# Patient Record
Sex: Male | Born: 1995 | Race: White | Hispanic: No | Marital: Single | Smoking: Current every day smoker
Health system: Southern US, Community
[De-identification: ages and names within clinical notes are randomized; demographics above are authoritative.]

## PROBLEM LIST (undated history)

## (undated) HISTORY — PX: TONSILLECTOMY: SUR1361

---
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
## 2014-11-05 ENCOUNTER — Emergency Department
Admission: EM | Admit: 2014-11-05 | Discharge: 2014-11-05 | Disposition: A | Discharge status: Home / Self Care | Payer: Medicaid Other | Attending: Emergency Medicine | Admitting: Emergency Medicine

## 2014-11-05 ENCOUNTER — Encounter: Payer: Self-pay | Admitting: Emergency Medicine

## 2014-11-05 ENCOUNTER — Emergency Department: Payer: Medicaid Other

## 2014-11-05 DIAGNOSIS — Y998 Other external cause status: Secondary | ICD-10-CM | POA: Diagnosis not present

## 2014-11-05 DIAGNOSIS — X58XXXA Exposure to other specified factors, initial encounter: Secondary | ICD-10-CM | POA: Insufficient documentation

## 2014-11-05 DIAGNOSIS — Z720 Tobacco use: Secondary | ICD-10-CM | POA: Diagnosis not present

## 2014-11-05 DIAGNOSIS — S299XXA Unspecified injury of thorax, initial encounter: Secondary | ICD-10-CM | POA: Diagnosis present

## 2014-11-05 DIAGNOSIS — R0781 Pleurodynia: Secondary | ICD-10-CM

## 2014-11-05 DIAGNOSIS — Y9289 Other specified places as the place of occurrence of the external cause: Secondary | ICD-10-CM | POA: Diagnosis not present

## 2014-11-05 DIAGNOSIS — Y9389 Activity, other specified: Secondary | ICD-10-CM | POA: Insufficient documentation

## 2014-11-05 DIAGNOSIS — S20211A Contusion of right front wall of thorax, initial encounter: Secondary | ICD-10-CM

## 2014-11-05 MED ORDER — IBUPROFEN 600 MG PO TABS
600.0000 mg | ORAL_TABLET | Freq: Once | ORAL | Status: AC
  Administered 2014-11-05: 600 mg via ORAL
  Filled 2014-11-05: qty 1

## 2014-11-05 NOTE — ED Notes (Signed)
Pt to ED from home c/o right rib pain.  Pt states moving furniture this afternoon when he felt a pop and sudden onset of severe sharp pain.  States more pain with movement and deep breaths.  Pt is A&Ox4, speaking in complete and coherent sentences and in NAD at this time.

## 2014-11-05 NOTE — ED Provider Notes (Signed)
[facility] Emergency Department Provider Note    ____________________________________________  Time seen: 0225  I have reviewed the triage vital signs and the nursing notes.   HISTORY  Chief Complaint Muscle Pain   History limited by: Not Limited   HPI Corey Ferguson is a 19 y.o. male who presents to the emergency department today because of right rib pain. He states that this started suddenly this afternoon. He states he was moving a heavy box time felt a pop. Since then he has been having sharp pain on the right side. He states it is worse with deep breaths. He does not have any associated shortness breath. He denies any fevers. He states he does have a history of a hairline fracture in his ribs in the past and this reminds him of that.   History reviewed. No pertinent past medical history.  There are no active problems to display for this patient.   Past Surgical History  Procedure Laterality Date  . Tonsillectomy      No current outpatient prescriptions on file.  Allergies Review of patient's allergies indicates no known allergies.  History reviewed. No pertinent family history.  Social History History  Substance Use Topics  . Smoking status: Current Every Day Smoker  . Smokeless tobacco: Not on file  . Alcohol Use: No    Review of Systems  Constitutional: Negative for fever. Cardiovascular: Positive for right-sided chest pain. Respiratory: Negative for shortness of breath. Gastrointestinal: Negative for abdominal pain, vomiting and diarrhea. Genitourinary: Negative for dysuria. Musculoskeletal: Negative for back pain. Skin: Negative for rash. Neurological: Negative for headaches, focal weakness or numbness.   10-point ROS otherwise negative.  ____________________________________________   PHYSICAL EXAM:  VITAL SIGNS: ED Triage Vitals  Enc Vitals Group     BP 11/05/14 0042 138/66 mmHg     Pulse Rate 11/05/14 0042 79      Resp 11/05/14 0042 20     Temp 11/05/14 0042 98.2 F (36.8 C)     Temp Source 11/05/14 0042 Oral     SpO2 11/05/14 0042 98 %     Weight 11/05/14 0042 235 lb (106.595 kg)     Height 11/05/14 0042  (1.778 m)     Head Cir --      Peak Flow --      Pain Score 11/05/14 0042 8   Constitutional: Alert and oriented. Well appearing and in no distress. Eyes: Conjunctivae are normal. PERRL. Normal extraocular movements. ENT   Head: Normocephalic and atraumatic.   Nose: No congestion/rhinnorhea.   Mouth/Throat: Mucous membranes are moist.   Neck: No stridor. Hematological/Lymphatic/Immunilogical: No cervical lymphadenopathy. Cardiovascular: Normal rate, regular rhythm.  No murmurs, rubs, or gallops. Respiratory: Normal respiratory effort without tachypnea nor retractions. Breath sounds are clear and equal bilaterally. No wheezes/rales/rhonchi. Tender to palpation over the anterior right ribs. Gastrointestinal: Soft and nontender. No distention. There is no CVA tenderness. Genitourinary: Deferred Musculoskeletal: Normal range of motion in all extremities. No joint effusions.  No lower extremity tenderness nor edema. Neurologic:  Normal speech and language. No gross focal neurologic deficits are appreciated. Speech is normal.  Skin:  Skin is warm, dry and intact. No rash noted. Psychiatric: Mood and affect are normal. Speech and behavior are normal. Patient exhibits appropriate insight and judgment.  ____________________________________________    LABS (pertinent positives/negatives)  None  ____________________________________________   EKG  None  ____________________________________________    RADIOLOGY  Right rib series IMPRESSION: Negative. ____________________________________________   PROCEDURES  Procedure(s) performed: None  Critical Care performed: No  ____________________________________________   INITIAL IMPRESSION / ASSESSMENT AND PLAN  / ED COURSE  Pertinent labs & imaging results that were available during my care of the patient were reviewed by me and considered in my medical decision making (see chart for details).  Patient presents to the emergency department today because of right chest wall pain. Right rib series without any concerning findings. I discussed with patient findings an x-ray.  ____________________________________________   FINAL CLINICAL IMPRESSION(S) / ED DIAGNOSES  Final diagnoses:  Rib pain on right side  Rib contusion, right, initial encounter     Phineas Semen, MD 11/05/14 [phone]

## 2014-11-05 NOTE — ED Notes (Signed)
Pt presents to ER alert and in NAD. Pt states pain in right rib after moving heavy object. Pt ambulatory.

## 2014-11-05 NOTE — Discharge Instructions (Signed)
Please seek medical attention for any high fevers, chest pain, shortness of breath, change in behavior, persistent vomiting, bloody stool or any other new or concerning symptoms. ° °Chest Contusion °A chest contusion is a deep bruise on your chest area. Contusions are the result of an injury that caused bleeding under the skin. A chest contusion may involve bruising of the skin, muscles, or ribs. The contusion may turn blue, purple, or yellow. Minor injuries will give you a painless contusion, but more severe contusions may stay painful and swollen for a few weeks. °CAUSES  °A contusion is usually caused by a blow, trauma, or direct force to an area of the body. °SYMPTOMS  °· Swelling and redness of the injured area. °· Discoloration of the injured area. °· Tenderness and soreness of the injured area. °· Pain. °DIAGNOSIS  °The diagnosis can be made by taking a history and performing a physical exam. An X-ray, CT scan, or MRI may be needed to determine if there were any associated injuries, such as broken bones (fractures) or internal injuries. °TREATMENT  °Often, the best treatment for a chest contusion is resting, icing, and applying cold compresses to the injured area. Deep breathing exercises may be recommended to reduce the risk of pneumonia. Over-the-counter medicines may also be recommended for pain control. °HOME CARE INSTRUCTIONS  °· Put ice on the injured area. °¨ Put ice in a plastic bag. °¨ Place a towel between your skin and the bag. °¨ Leave the ice on for 15-20 minutes, 03-04 times a day. °· Only take over-the-counter or prescription medicines as directed by your caregiver. Your caregiver may recommend avoiding anti-inflammatory medicines (aspirin, ibuprofen, and naproxen) for 48 hours because these medicines may increase bruising. °· Rest the injured area. °· Perform deep-breathing exercises as directed by your caregiver. °· Stop smoking if you smoke. °· Do not lift objects over 5 pounds (2.3 kg) for  3 days or longer if recommended by your caregiver. °SEEK IMMEDIATE MEDICAL CARE IF:  °· You have increased bruising or swelling. °· You have pain that is getting worse. °· You have difficulty breathing. °· You have dizziness, weakness, or fainting. °· You have blood in your urine or stool. °· You cough up or vomit blood. °· Your swelling or pain is not relieved with medicines. °MAKE SURE YOU:  °· Understand these instructions. °· Will watch your condition. °· Will get help right away if you are not doing well or get worse. °Document Released: 12/17/2000 Document Revised: 12/17/2011 Document Reviewed: 09/15/2011 °[employer]® Patient Information ©2015 [employer]. This information is not intended to replace advice given to you by your health care provider. Make sure you discuss any questions you have with your health care provider. ° °

## 2014-11-05 NOTE — ED Notes (Signed)
Patient transported to X-ray 

## 2016-02-24 IMAGING — CR DG RIBS 2V*R*
1 series · 3 of 3 positions shown · non-contrast
Comparison: None.

CLINICAL DATA: Right anterior chest wall pain after moving
furniture today

EXAM:
RIGHT RIBS - 2 VIEW

[Series 1: dg ribs unilateral w/chest right · 0.14mm/px · 3 of 3 slices shown]
[im 1/3]
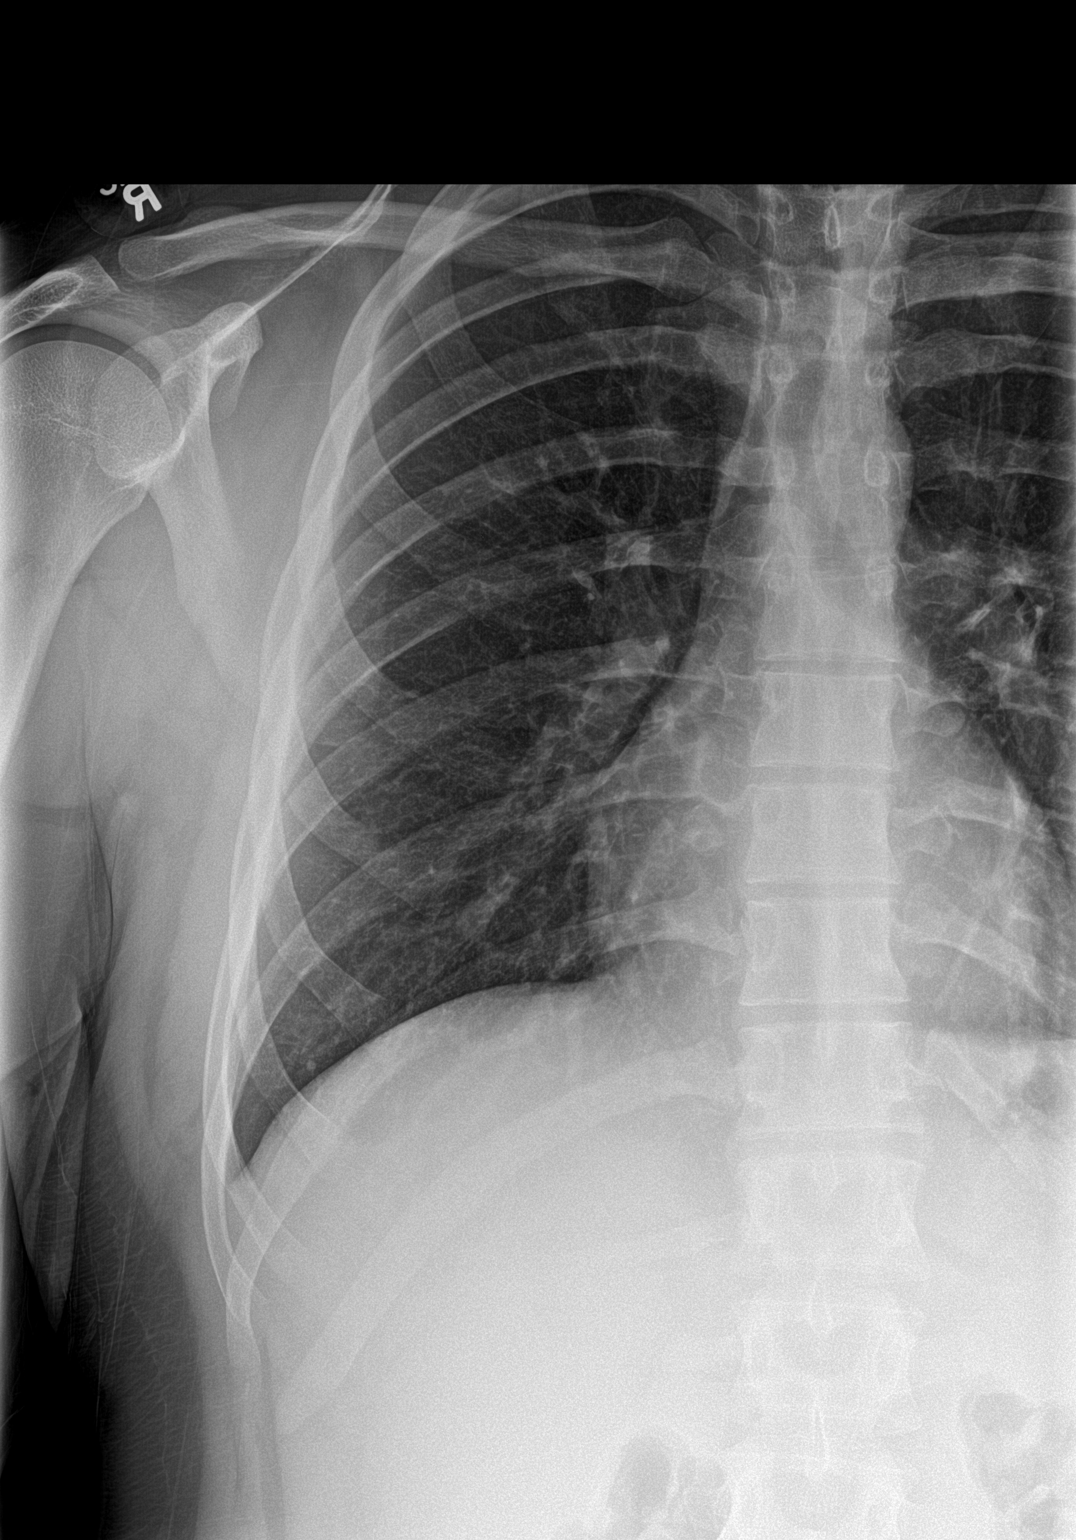
[im 2/3]
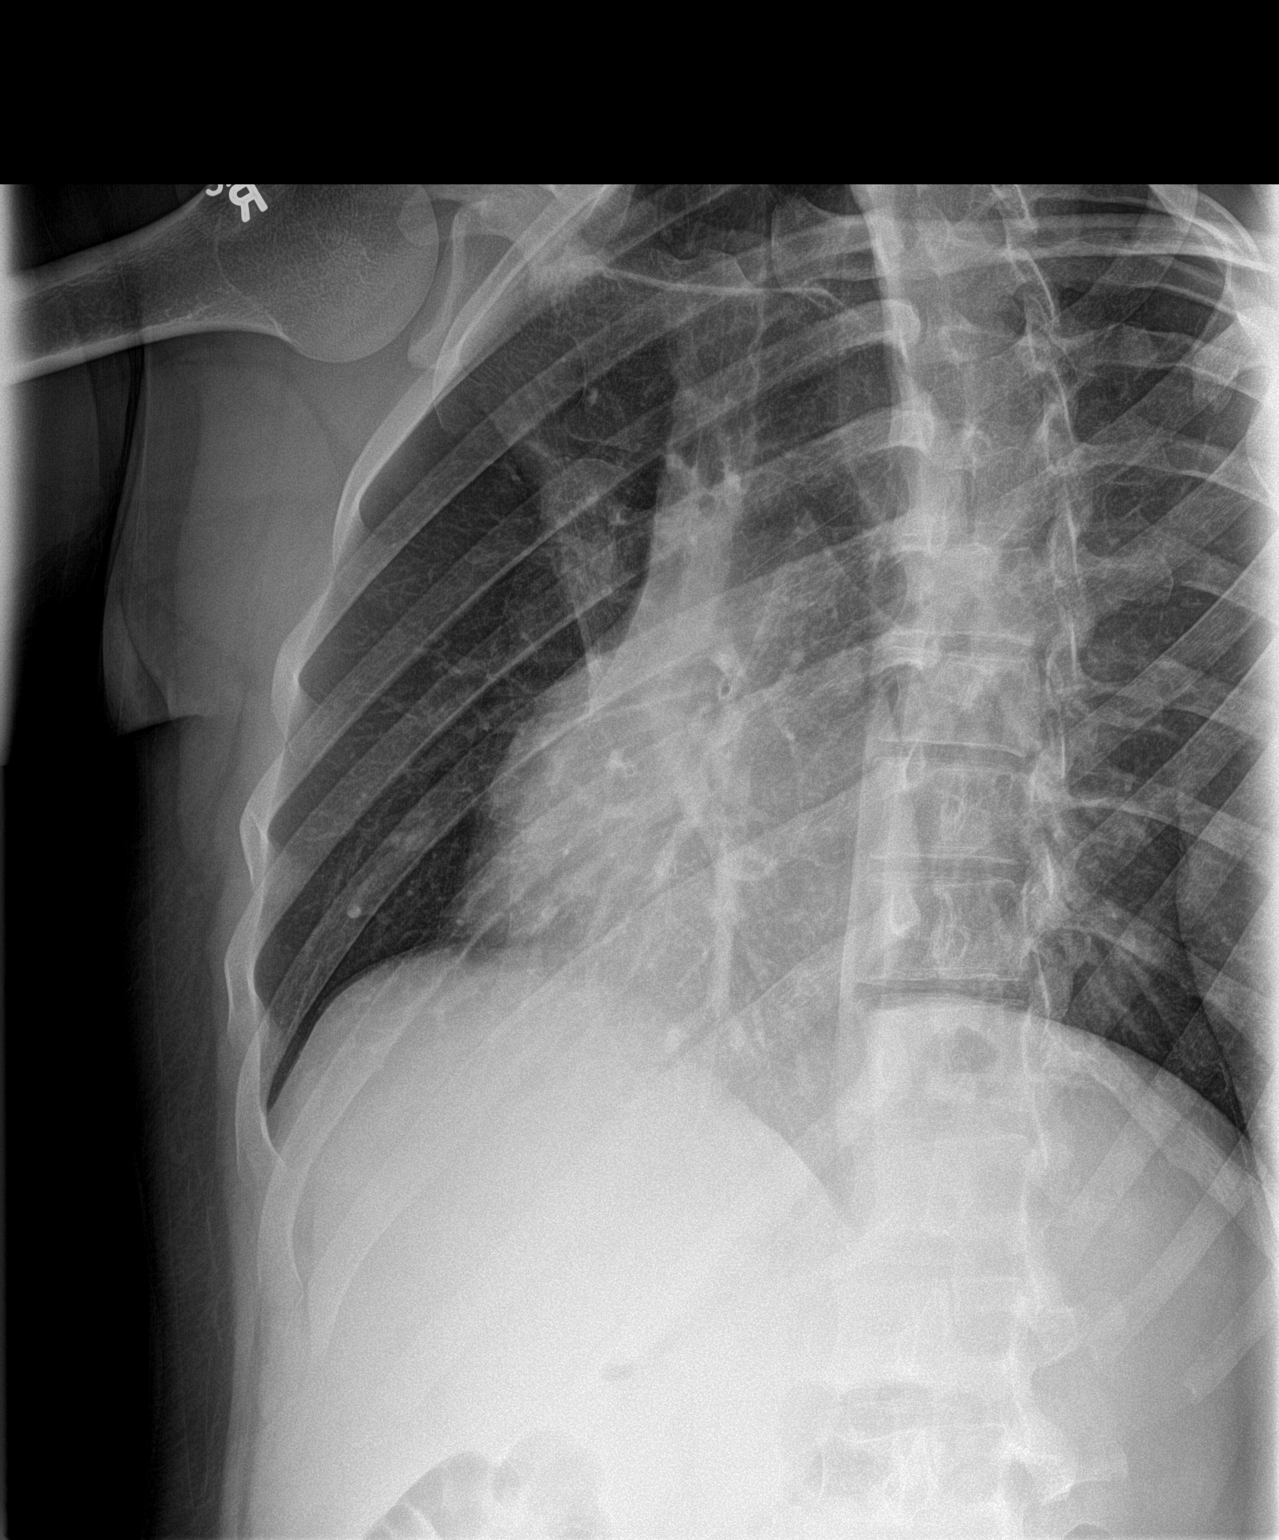
[im 3/3]
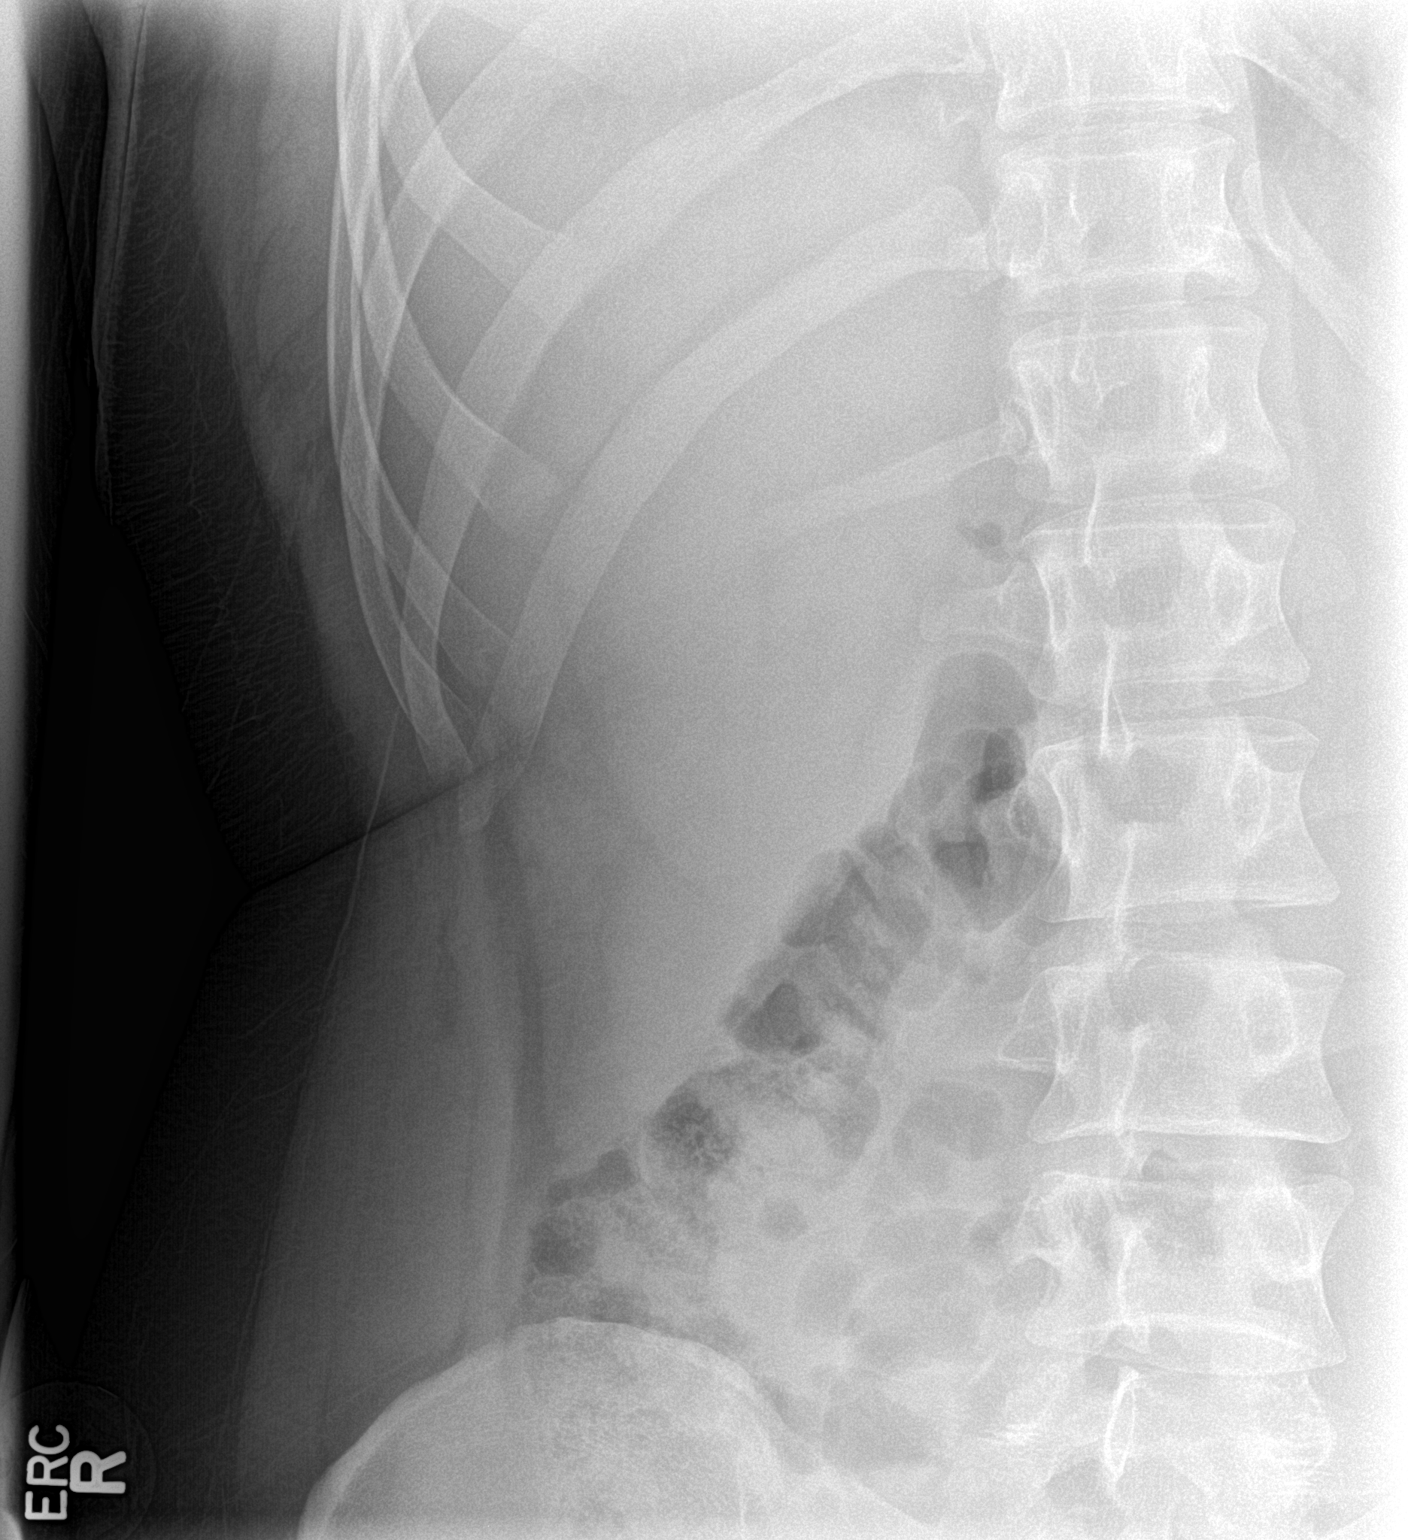

[3 of 3 positions shown; findings below may reference images not displayed]

FINDINGS: No fracture or other bone lesions are seen involving the ribs.
IMPRESSION: Negative.
